# Patient Record
Sex: Female | Born: 1965 | Race: Black or African American | Hispanic: No | Marital: Married
Health system: Southern US, Community
[De-identification: ages and names within clinical notes are randomized; demographics above are authoritative.]

## PROBLEM LIST (undated history)

## (undated) DIAGNOSIS — Z1379 Encounter for other screening for genetic and chromosomal anomalies: Principal | ICD-10-CM

## (undated) HISTORY — DX: Encounter for other screening for genetic and chromosomal anomalies: Z13.79

---

## 2007-09-03 ENCOUNTER — Encounter: Admission: RE | Admit: 2007-09-03 | Discharge: 2007-09-03 | Payer: Self-pay | Admitting: Obstetrics and Gynecology

## 2008-09-11 ENCOUNTER — Ambulatory Visit (HOSPITAL_BASED_OUTPATIENT_CLINIC_OR_DEPARTMENT_OTHER): Admission: RE | Admit: 2008-09-11 | Discharge: 2008-09-11 | Payer: Self-pay | Admitting: Unknown Physician Specialty

## 2009-09-14 ENCOUNTER — Ambulatory Visit (HOSPITAL_BASED_OUTPATIENT_CLINIC_OR_DEPARTMENT_OTHER): Admission: RE | Admit: 2009-09-14 | Discharge: 2009-09-14 | Payer: Self-pay | Admitting: Obstetrics and Gynecology

## 2009-09-14 ENCOUNTER — Ambulatory Visit: Payer: Self-pay | Admitting: Diagnostic Radiology

## 2010-10-07 ENCOUNTER — Ambulatory Visit: Payer: Self-pay | Admitting: Diagnostic Radiology

## 2010-10-07 ENCOUNTER — Ambulatory Visit (HOSPITAL_BASED_OUTPATIENT_CLINIC_OR_DEPARTMENT_OTHER): Admission: RE | Admit: 2010-10-07 | Discharge: 2010-10-07 | Payer: Self-pay | Admitting: Internal Medicine

## 2010-12-24 ENCOUNTER — Encounter: Payer: Self-pay | Admitting: Obstetrics and Gynecology

## 2011-09-29 ENCOUNTER — Other Ambulatory Visit (HOSPITAL_BASED_OUTPATIENT_CLINIC_OR_DEPARTMENT_OTHER): Payer: Self-pay | Admitting: *Deleted

## 2011-09-29 DIAGNOSIS — Z1231 Encounter for screening mammogram for malignant neoplasm of breast: Secondary | ICD-10-CM

## 2011-10-11 ENCOUNTER — Ambulatory Visit (HOSPITAL_BASED_OUTPATIENT_CLINIC_OR_DEPARTMENT_OTHER)
Admission: RE | Admit: 2011-10-11 | Discharge: 2011-10-11 | Disposition: A | Discharge status: Home / Self Care | Payer: PRIVATE HEALTH INSURANCE | Source: Ambulatory Visit | Attending: Diagnostic Radiology | Admitting: Diagnostic Radiology

## 2011-10-11 DIAGNOSIS — Z1231 Encounter for screening mammogram for malignant neoplasm of breast: Secondary | ICD-10-CM | POA: Insufficient documentation

## 2011-10-12 ENCOUNTER — Other Ambulatory Visit (HOSPITAL_BASED_OUTPATIENT_CLINIC_OR_DEPARTMENT_OTHER): Payer: Self-pay | Admitting: Unknown Physician Specialty

## 2011-10-12 DIAGNOSIS — Z1231 Encounter for screening mammogram for malignant neoplasm of breast: Secondary | ICD-10-CM

## 2012-09-23 ENCOUNTER — Other Ambulatory Visit (HOSPITAL_BASED_OUTPATIENT_CLINIC_OR_DEPARTMENT_OTHER): Payer: Self-pay | Admitting: Internal Medicine

## 2012-09-23 DIAGNOSIS — Z1231 Encounter for screening mammogram for malignant neoplasm of breast: Secondary | ICD-10-CM

## 2012-10-11 ENCOUNTER — Ambulatory Visit (HOSPITAL_BASED_OUTPATIENT_CLINIC_OR_DEPARTMENT_OTHER): Payer: PRIVATE HEALTH INSURANCE

## 2012-10-14 ENCOUNTER — Ambulatory Visit (HOSPITAL_BASED_OUTPATIENT_CLINIC_OR_DEPARTMENT_OTHER): Payer: PRIVATE HEALTH INSURANCE

## 2012-10-16 ENCOUNTER — Ambulatory Visit (HOSPITAL_BASED_OUTPATIENT_CLINIC_OR_DEPARTMENT_OTHER)
Admission: RE | Admit: 2012-10-16 | Discharge: 2012-10-16 | Disposition: A | Discharge status: Home / Self Care | Payer: BC Managed Care – PPO | Source: Ambulatory Visit | Attending: Internal Medicine | Admitting: Internal Medicine

## 2012-10-16 DIAGNOSIS — Z1231 Encounter for screening mammogram for malignant neoplasm of breast: Secondary | ICD-10-CM | POA: Insufficient documentation

## 2013-09-09 ENCOUNTER — Other Ambulatory Visit (HOSPITAL_BASED_OUTPATIENT_CLINIC_OR_DEPARTMENT_OTHER): Payer: Self-pay | Admitting: Internal Medicine

## 2013-09-09 DIAGNOSIS — Z1231 Encounter for screening mammogram for malignant neoplasm of breast: Secondary | ICD-10-CM

## 2013-10-17 ENCOUNTER — Other Ambulatory Visit (HOSPITAL_BASED_OUTPATIENT_CLINIC_OR_DEPARTMENT_OTHER): Payer: Self-pay | Admitting: Medical

## 2013-10-17 ENCOUNTER — Ambulatory Visit (HOSPITAL_BASED_OUTPATIENT_CLINIC_OR_DEPARTMENT_OTHER)
Admission: RE | Admit: 2013-10-17 | Discharge: 2013-10-17 | Disposition: A | Discharge status: Home / Self Care | Payer: BC Managed Care – PPO | Source: Ambulatory Visit | Attending: Internal Medicine | Admitting: Internal Medicine

## 2013-10-17 DIAGNOSIS — Z1231 Encounter for screening mammogram for malignant neoplasm of breast: Secondary | ICD-10-CM | POA: Insufficient documentation

## 2014-11-03 ENCOUNTER — Other Ambulatory Visit (HOSPITAL_BASED_OUTPATIENT_CLINIC_OR_DEPARTMENT_OTHER): Payer: Self-pay | Admitting: Medical

## 2014-11-03 DIAGNOSIS — Z1231 Encounter for screening mammogram for malignant neoplasm of breast: Secondary | ICD-10-CM

## 2014-11-06 ENCOUNTER — Other Ambulatory Visit (HOSPITAL_BASED_OUTPATIENT_CLINIC_OR_DEPARTMENT_OTHER): Payer: Self-pay | Admitting: Internal Medicine

## 2014-11-06 ENCOUNTER — Ambulatory Visit (HOSPITAL_BASED_OUTPATIENT_CLINIC_OR_DEPARTMENT_OTHER)
Admission: RE | Admit: 2014-11-06 | Discharge: 2014-11-06 | Disposition: A | Discharge status: Home / Self Care | Payer: BC Managed Care – PPO | Source: Ambulatory Visit | Attending: Medical | Admitting: Medical

## 2014-11-06 DIAGNOSIS — Z1231 Encounter for screening mammogram for malignant neoplasm of breast: Secondary | ICD-10-CM | POA: Insufficient documentation

## 2015-11-17 ENCOUNTER — Other Ambulatory Visit: Payer: Self-pay

## 2015-11-17 DIAGNOSIS — Z1231 Encounter for screening mammogram for malignant neoplasm of breast: Secondary | ICD-10-CM

## 2015-11-18 ENCOUNTER — Other Ambulatory Visit (HOSPITAL_BASED_OUTPATIENT_CLINIC_OR_DEPARTMENT_OTHER): Payer: Self-pay | Admitting: Internal Medicine

## 2015-11-18 DIAGNOSIS — Z1231 Encounter for screening mammogram for malignant neoplasm of breast: Secondary | ICD-10-CM

## 2015-11-25 ENCOUNTER — Ambulatory Visit (HOSPITAL_BASED_OUTPATIENT_CLINIC_OR_DEPARTMENT_OTHER)
Admission: RE | Admit: 2015-11-25 | Discharge: 2015-11-25 | Disposition: A | Discharge status: Home / Self Care | Payer: BLUE CROSS/BLUE SHIELD | Source: Ambulatory Visit | Attending: Internal Medicine | Admitting: Internal Medicine

## 2015-11-25 ENCOUNTER — Other Ambulatory Visit (HOSPITAL_BASED_OUTPATIENT_CLINIC_OR_DEPARTMENT_OTHER): Payer: Self-pay | Admitting: Internal Medicine

## 2015-11-25 DIAGNOSIS — Z1231 Encounter for screening mammogram for malignant neoplasm of breast: Secondary | ICD-10-CM | POA: Insufficient documentation

## 2016-11-15 ENCOUNTER — Other Ambulatory Visit (HOSPITAL_BASED_OUTPATIENT_CLINIC_OR_DEPARTMENT_OTHER): Payer: Self-pay | Admitting: Internal Medicine

## 2016-11-15 DIAGNOSIS — Z1231 Encounter for screening mammogram for malignant neoplasm of breast: Secondary | ICD-10-CM

## 2016-11-30 ENCOUNTER — Ambulatory Visit (HOSPITAL_BASED_OUTPATIENT_CLINIC_OR_DEPARTMENT_OTHER)
Admission: RE | Admit: 2016-11-30 | Discharge: 2016-11-30 | Disposition: A | Discharge status: Home / Self Care | Payer: BLUE CROSS/BLUE SHIELD | Source: Ambulatory Visit | Attending: Internal Medicine | Admitting: Internal Medicine

## 2016-11-30 DIAGNOSIS — Z1231 Encounter for screening mammogram for malignant neoplasm of breast: Secondary | ICD-10-CM | POA: Diagnosis not present

## 2017-07-26 ENCOUNTER — Other Ambulatory Visit: Payer: BLUE CROSS/BLUE SHIELD

## 2017-07-26 ENCOUNTER — Encounter: Payer: Self-pay | Admitting: Genetics

## 2017-07-26 ENCOUNTER — Ambulatory Visit: Payer: BLUE CROSS/BLUE SHIELD | Admitting: Genetics

## 2017-07-26 DIAGNOSIS — Z800 Family history of malignant neoplasm of digestive organs: Secondary | ICD-10-CM

## 2017-07-26 NOTE — Progress Notes (Addendum)
REFERRING PROVIDER: Janine Limbo, PA-C [place], [place] [address]  PRIMARY PROVIDER:  O'Buch, Greta, PA-C  PRIMARY REASON FOR VISIT:  1. Family history of Lynch syndrome     HISTORY OF PRESENT ILLNESS:   Ms. Branagan, a 51 y.o. female, was seen for a [facility] cancer genetics consultation at the request of her sister due to a family history of Lynch syndrome.  Ms. Seeman's sister, Berline Chough DOB 11/07/1968, recently tested positive for a mutation in MSH6 called c.[id number] (p.Ile944Metfs*4).  Ms. Wehrman presents to clinic today to discuss the possibility of a hereditary predisposition to cancer, genetic testing, and to further clarify her future cancer risks, as well as potential cancer risks for family members.   Ms. Kuklinski is a 51 y.o. female with no personal history of cancer.    CANCER HISTORY:   No history exists.    HORMONAL RISK FACTORS:  Menarche was at age 32.  First live birth at age 2.  Ovaries intact: yes.  Hysterectomy: no.  Menopausal status: perimenopausal.  HRT use: 0 years. Colonoscopy: yes; abnormal. Ms. Hulme reports that "a few" polyps were identified and that she was advised to return in 2 years. Mammogram within the last year: yes. Number of breast biopsies: 0. Up to date with pelvic exams:  yes. Any excessive radiation exposure in the past:  no  No past medical history on file.  No past surgical history on file.  Social History   Social History  . Marital status: Married    Spouse name: N/A  . Number of children: N/A  . Years of education: N/A   Social History Main Topics  . Smoking status: Not on file  . Smokeless tobacco: Not on file  . Alcohol use Not on file  . Drug use: Unknown  . Sexual activity: Not on file   Other Topics Concern  . Not on file   Social History Narrative  . No narrative on file     FAMILY HISTORY:  We obtained a detailed, 4-generation family history.  Significant diagnoses  are listed below: Family History  Problem Relation Age of Onset  . Aneurysm Mother 77       d.55  . Heart attack Father 65       d.62  . Breast cancer Sister 63       triple negative  . Other Sister        MSH6 mutation-Lynch syndrome  . Cancer Maternal Uncle        d.68s with unspecified type of cancer  . Other Paternal Uncle        d.30s MVA  . Cancer Maternal Grandmother        d.88s with unspecified type of cancer  . Uterine cancer Sister 9       treated with hysterectomy   Ms. Mesenbrink has four sisters, ages 23, 61, 53, and 15. The 14 year old sister, Ilona Sorrel, was diagnosed with uterine cancer at age 63. Her only treatment was hysterectomy. The 67 year old sister, Lannette Donath, was diagnosed with triple negative breast cancer at 66. 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 A copy of Priscilla's genetic testing result report is included at then end of this note for reference.  Ms. Badgett's mother died of an aneurysm at age 58. Ms. Wallis has five maternal uncles and three maternal aunts. One uncle died with lung cancer in his late-60s. Ms. Grainger's maternal grandmother died in her late-80s  and had a history of an unspecified form of cancer--possibly stomach or pancreatic. Ms. Hawkes's maternal grandfather died in his late-80s without cancers.  Ms. Wadley's father died at age 36 from a heart attack. Ms. Banta has two paternal aunts who are both living without cancers. Her only paternal uncle died in his 49s in a car accident. Both of Ms. Angell's paternal grandparents are deceased, but there is no further information about them.  Patient's maternal ancestors are of Native [place] and [place] American descent, and paternal ancestors are of African American descent. There is no reported Ashkenazi Jewish ancestry. There is no known consanguinity.  GENETIC COUNSELING ASSESSMENT: Shareena Nusz is a 51 y.o. female with a family  history of Lynch syndrome. Because her sister carries the c.2832_2833delAA (p.Ile944Metfs*4) MSH6 mutation, there is a 50% chance that she also carries this mutation. We, therefore, discussed and recommended the following at today's visit.   DISCUSSION: We reviewed the characteristics, risks, management, and inheritance patterns of Lynch syndrome, specifically those associated with mutations in MSH6. We also discussed the process of testing, cost, turn-around-time for results, and GINA. We discussed the implications of a negative, positive, and variant of uncertain significant result. We discussed Ms. Grassi's option to pursue genetic testing for the MSH6 gene only vs testing through a larger hereditary cancer panel. She elected to test MSH6 only.  Genetic testing will be performed through [facility]'s Family Variant Testing program in which Ms. Madole can be tested for free if performed within 90 days of Priscilla's results.   Due to her family history of breast cancer, we calculated Ms. Sanz's lifetime risk for breast cancer using the Tyrer-Cuzick (IBIS) Breast Cancer Risk Model. This model estimates risk through analysis of personal health history and family history. According to this model, her lifetime risk of developing breast cancer is 9.1%. Though this is slightly above average risk for other women her age, Ms. Siems's lifetime breast cancer risk does not appear to warrant increased breast screenings at this time. She was encouraged to continue annual mammograms unless otherwise advised by her physicians.       PLAN: After considering the risks, benefits, and limitations, Ms. Krise  provided informed consent to pursue genetic testing and the blood sample was sent to [facility] for analysis of the MSH6 gene. Results should be available within approximately 2-3 weeks' time, at which point they will be disclosed by telephone to Ms. Mol, as will any additional recommendations  warranted by these results. This information will also be available in Epic.   Lastly, we encouraged Ms. Clendenen to remain in contact with cancer genetics annually so that we can continuously update the family history and inform her of any changes in cancer genetics and testing that may be of benefit for this family.   Ms. Kabel's questions were answered to her satisfaction today. Our contact information was provided should additional questions or concerns arise.   Mal Misty, MS, [facility] Certified Naval [email or web address] phone: [phone]  The patient was seen for a total of 40 minutes in face-to-face genetic counseling.    _______________________________________________________________________ For Office Staff:  Number of people involved in session: 1 Was an Intern/ student involved with case: no  [facility] RESULT REPORT

## 2017-08-09 ENCOUNTER — Encounter: Payer: Self-pay | Admitting: Genetics

## 2017-08-09 ENCOUNTER — Telehealth: Payer: Self-pay | Admitting: Genetics

## 2017-08-09 ENCOUNTER — Ambulatory Visit: Payer: Self-pay | Admitting: Genetics

## 2017-08-09 DIAGNOSIS — Z1379 Encounter for other screening for genetic and chromosomal anomalies: Secondary | ICD-10-CM

## 2017-08-09 HISTORY — DX: Encounter for other screening for genetic and chromosomal anomalies: Z13.79

## 2017-08-09 NOTE — Telephone Encounter (Signed)
Reviewed that sequencing and deletion/duplication analysis of the MSH6 gene revealed no pathogenic mutations. Specifically, this means that Sarah Carney tested negative for the MSH6 c.2832_2833delAA (p.Ile944Metfs*4) mutation that was previously identified in her sister, Sarah Carney.  Testing was performed through [facility]'s sequencing and deletion/duplication analysis of MSH6. Only pathogenic or likely pathogenic mutations are reported through this testing (variants of uncertain significance are not reported). For more detailed discussion, please see genetic counseling documentation from 08/09/2017. Result report dated 08/08/2017.

## 2017-08-09 NOTE — Progress Notes (Addendum)
HPI: Ms. Wawrzyniak was previously seen in the [facility] on 07/26/2017 due to a family history of Lynch syndrome.  Ms. Hewitt's sister, Berline Chough DOB 11/07/1970, recently tested positive for a mutation in MSH6 called c.2832_2833delAA (p.Ile944Metfs*4). Please refer to our prior cancer genetics clinic note for more information regarding Ms. Donaghue's medical, social and family histories, and our assessment and recommendations, at the time. Ms. Bevill's recent genetic test results were disclosed to her, as were recommendations warranted by these results. These results and recommendations are discussed in more detail below.  CANCER HISTORY: Ms. Shroff is a 52 y.o. female with no personal history of cancer.     FAMILY HISTORY:  We obtained a detailed, 4-generation family history.  Significant diagnoses are listed below: Family History  Problem Relation Age of Onset  . Aneurysm Mother 11       d.55  . Heart attack Father 72       d.62  . Breast cancer Sister 33       triple negative  . Other Sister        MSH6 mutation-Lynch syndrome  . Cancer Maternal Uncle        d.68s with unspecified type of cancer  . Other Paternal Uncle        d.30s MVA  . Cancer Maternal Grandmother        d.88s with unspecified type of cancer  . Uterine cancer Sister 62       treated with hysterectomy   Ms. Lofton has four sisters, ages 39, 54, 28, and 80. The 65 year old sister, Ilona Sorrel, was diagnosed with uterine cancer at age 83. Her only treatment was hysterectomy. The 21 year old sister, Lannette Donath, was diagnosed with triple negative breast cancer at 74. 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 A copy of Priscilla's genetic testing result report is included at then end of this note for reference.  Ms. Margulies's mother died of an aneurysm at age 34. Ms. Califano has five maternal uncles and three maternal aunts. One uncle died  with lung cancer in his late-60s. Ms. Karpf's maternal grandmother died in her late-80s and had a history of an unspecified form of cancer--possibly stomach or pancreatic. Ms. Blackham's maternal grandfather died in his late-80s without cancers.  Ms. Bolds's father died at age 54 from a heart attack. Ms. Pottenger has two paternal aunts who are both living without cancers. Her only paternal uncle died in his 82s in a car accident. Both of Ms. Berres's paternal grandparents are deceased, but there is no further information about them.  Patient's maternal ancestors are of Native American and African Americandescent, and paternal ancestors are of African Americandescent. There is noreported Ashkenazi Jewish ancestry. There is noknown consanguinity.  GENETIC TEST RESULTS: Sequencing and deletion/duplication analysis of the MSH6 gene, performed by [facility] and reported out on 08/08/2017, showed no pathogenic mutations. Specifically, this means that Ms. Benzel tested negative for the MSH6 c.2832_2833delAA (p.Ile944Metfs*4) mutation that was previously identified in her sister, Lannette Donath.  The test report will be scanned into EPIC and will be located under the Molecular Pathology section of the Results Review tab.A portion of the result report is included below for reference.     At her initial appointment, we discussed Ms. Landen's option topursue genetic testing for the MSH6gene only vs testing through a larger hereditary cancer panel. She elected to test MSH6 only. Ms. Dicioccio's test was normal and did not reveal the familial mutation.  We call this result a true negative result because the cancer-causing mutation was identified in Ms. Blancett's family, and she did not inherit it.  Given this negative result, Ms. Reap's chances of developing Lynch syndrome-related cancers are the same as they are in the general population. As discussed at her initial appointment, Priscilla's history of breast cancer is  unlikely to be explained by Lynch syndrome. Therefore, Ms. Virgo remains at slightly above-average risk for breast cancer based on family history alone. She should continue annual mammograms unless otherwise advised by her physicians.  ADDITIONAL GENETIC TESTING: We discussed with Ms. Casella that there are other genes that are associated with increased cancer risk that can be analyzed. Without testing additional genes, we cannot rule out the chance that Ms. Hollenberg carries a mutation in a different genet is associated with hereditary cancer risk. However, given that Ms. Seyer has no personal history of cancer and hereditary cancer syndromes are rare, Ms. Baine does not appear to be at high risk for other hereditary cancer syndromes. I recommended that Ms. Machamer's sister, Ilona Sorrel, who had uterine cancer at age 10 undergo genetic testing. Pattie's results would further inform Ms. Garrette's chance to carry a mutation in an untested gene. Should Ms. Bussie wish to pursue additional genetic testing for herself, we are happy to discuss and coordinate this testing, at any time.   CANCER SCREENING RECOMMENDATIONS: Because Ms. Eberle tested negative for the known familial mutation in MSH6, she was encouraged to follow age-based cancer screening recommendations for the general population unless otherwise advised by her physicians.   RECOMMENDATIONS FOR FAMILY MEMBERS: Because Ms. Sula tested negative for the known familial mutation in MSH6, her daughter is not at risk to inherit this mutation and does not need to be tested unless her paternal family history is suggestive of a hereditary cancer syndrome. Genetic testing is recommended for each of Ms. Obando's untested sisters and other extended relatives.  FOLLOW-UP: Lastly, we discussed with Ms. Peraza that cancer genetics is a rapidly advancing field and it is possible that new genetic tests will be appropriate for her and/or her family members in the future. We  encouraged her to remain in contact with cancer genetics on an annual basis so we can update her personal and family histories and let her know of advances in cancer genetics that may benefit this family.   Per Ms. Folse's request, a copy of her results and genetic counseling notes were emailed to her address on file.  Our contact number was provided. Ms. Prieur's questions were answered to her satisfaction, and she knows she is welcome to call us at anytime with additional questions or concerns.   Mal Misty, MS, [facility] Certified Naval [email or web address]

## 2018-01-17 ENCOUNTER — Other Ambulatory Visit (HOSPITAL_BASED_OUTPATIENT_CLINIC_OR_DEPARTMENT_OTHER): Payer: Self-pay | Admitting: Internal Medicine

## 2018-01-17 ENCOUNTER — Ambulatory Visit (HOSPITAL_BASED_OUTPATIENT_CLINIC_OR_DEPARTMENT_OTHER)
Admission: RE | Admit: 2018-01-17 | Discharge: 2018-01-17 | Disposition: A | Discharge status: Home / Self Care | Payer: BLUE CROSS/BLUE SHIELD | Source: Ambulatory Visit | Attending: Internal Medicine | Admitting: Internal Medicine

## 2018-01-17 DIAGNOSIS — Z1231 Encounter for screening mammogram for malignant neoplasm of breast: Secondary | ICD-10-CM | POA: Diagnosis not present

## 2018-01-21 ENCOUNTER — Other Ambulatory Visit: Payer: Self-pay | Admitting: Internal Medicine

## 2018-01-21 DIAGNOSIS — R928 Other abnormal and inconclusive findings on diagnostic imaging of breast: Secondary | ICD-10-CM

## 2018-01-23 ENCOUNTER — Ambulatory Visit
Admission: RE | Admit: 2018-01-23 | Discharge: 2018-01-23 | Disposition: A | Discharge status: Home / Self Care | Payer: BLUE CROSS/BLUE SHIELD | Source: Ambulatory Visit | Attending: Internal Medicine | Admitting: Internal Medicine

## 2018-01-23 ENCOUNTER — Other Ambulatory Visit: Payer: Self-pay | Admitting: Internal Medicine

## 2018-01-23 DIAGNOSIS — R928 Other abnormal and inconclusive findings on diagnostic imaging of breast: Secondary | ICD-10-CM

## 2018-01-24 ENCOUNTER — Other Ambulatory Visit: Payer: BLUE CROSS/BLUE SHIELD

## 2019-09-25 ENCOUNTER — Other Ambulatory Visit: Payer: Self-pay | Admitting: Internal Medicine

## 2019-09-25 DIAGNOSIS — Z1231 Encounter for screening mammogram for malignant neoplasm of breast: Secondary | ICD-10-CM

## 2019-11-12 ENCOUNTER — Other Ambulatory Visit: Payer: Self-pay

## 2019-11-12 ENCOUNTER — Ambulatory Visit
Admission: RE | Admit: 2019-11-12 | Discharge: 2019-11-12 | Disposition: A | Discharge status: Home / Self Care | Payer: BC Managed Care – PPO | Source: Ambulatory Visit | Attending: Internal Medicine | Admitting: Internal Medicine

## 2019-11-12 DIAGNOSIS — Z1231 Encounter for screening mammogram for malignant neoplasm of breast: Secondary | ICD-10-CM

## 2020-11-11 ENCOUNTER — Other Ambulatory Visit: Payer: Self-pay | Admitting: Physician Assistant

## 2020-11-11 DIAGNOSIS — Z1231 Encounter for screening mammogram for malignant neoplasm of breast: Secondary | ICD-10-CM

## 2020-12-15 ENCOUNTER — Other Ambulatory Visit: Payer: Self-pay

## 2020-12-15 ENCOUNTER — Ambulatory Visit
Admission: RE | Admit: 2020-12-15 | Discharge: 2020-12-15 | Disposition: A | Discharge status: Home / Self Care | Payer: BC Managed Care – PPO | Source: Ambulatory Visit | Attending: Physician Assistant | Admitting: Physician Assistant

## 2020-12-15 DIAGNOSIS — Z1231 Encounter for screening mammogram for malignant neoplasm of breast: Secondary | ICD-10-CM

## 2021-12-15 ENCOUNTER — Other Ambulatory Visit: Payer: Self-pay | Admitting: Physician Assistant

## 2021-12-15 DIAGNOSIS — Z1231 Encounter for screening mammogram for malignant neoplasm of breast: Secondary | ICD-10-CM

## 2021-12-28 ENCOUNTER — Other Ambulatory Visit: Payer: Self-pay

## 2021-12-28 ENCOUNTER — Ambulatory Visit
Admission: RE | Admit: 2021-12-28 | Discharge: 2021-12-28 | Disposition: A | Discharge status: Home / Self Care | Payer: BC Managed Care – PPO | Source: Ambulatory Visit | Attending: Physician Assistant | Admitting: Physician Assistant

## 2021-12-28 DIAGNOSIS — Z1231 Encounter for screening mammogram for malignant neoplasm of breast: Secondary | ICD-10-CM

## 2022-06-22 IMAGING — MG MM DIGITAL SCREENING BILAT W/ TOMO AND CAD
8 series · 8 of 24 positions shown · non-contrast
Comparison: Previous exam(s).

CLINICAL DATA: Screening.

EXAM:
DIGITAL SCREENING BILATERAL MAMMOGRAM WITH TOMOSYNTHESIS AND CAD
TECHNIQUE: Bilateral screening digital craniocaudal and mediolateral oblique
mammograms were obtained. Bilateral screening digital breast
tomosynthesis was performed. The images were evaluated with
computer-aided detection.

[R MLO synth-2D]
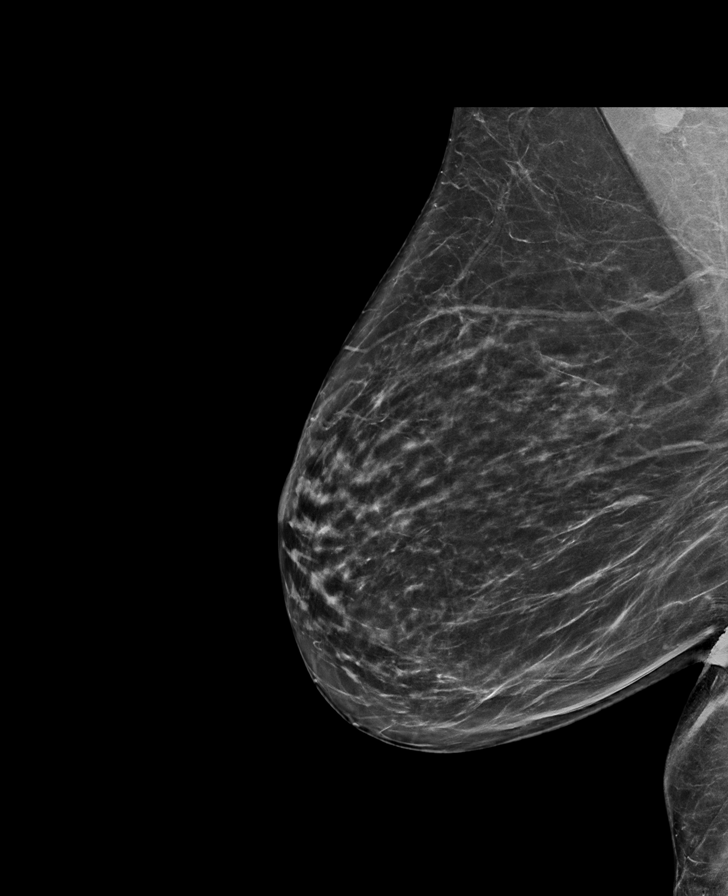

[R CC synth-2D]
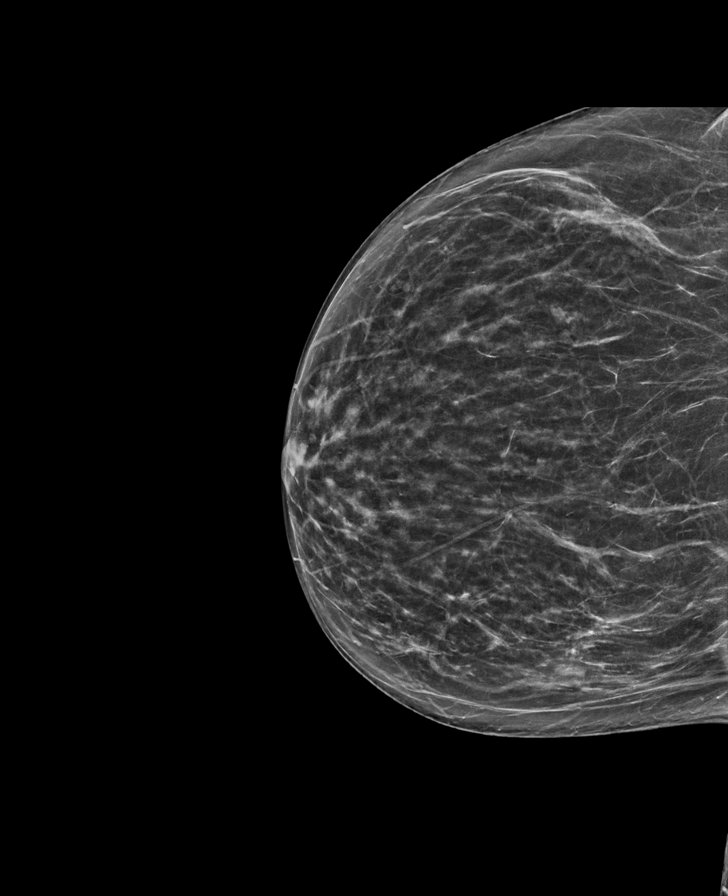

[L CC synth-2D]
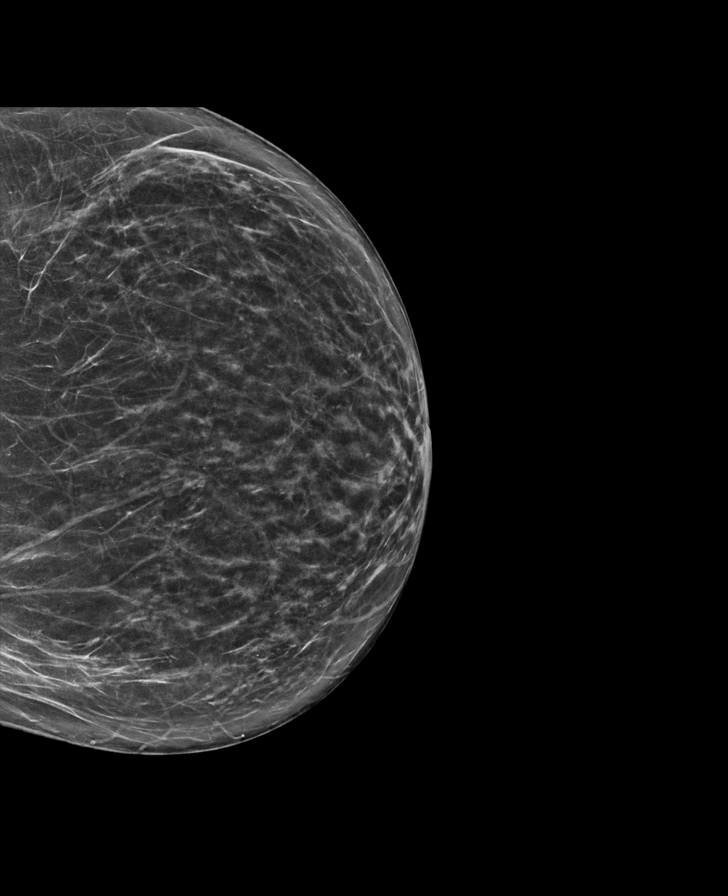

[L MLO synth-2D]
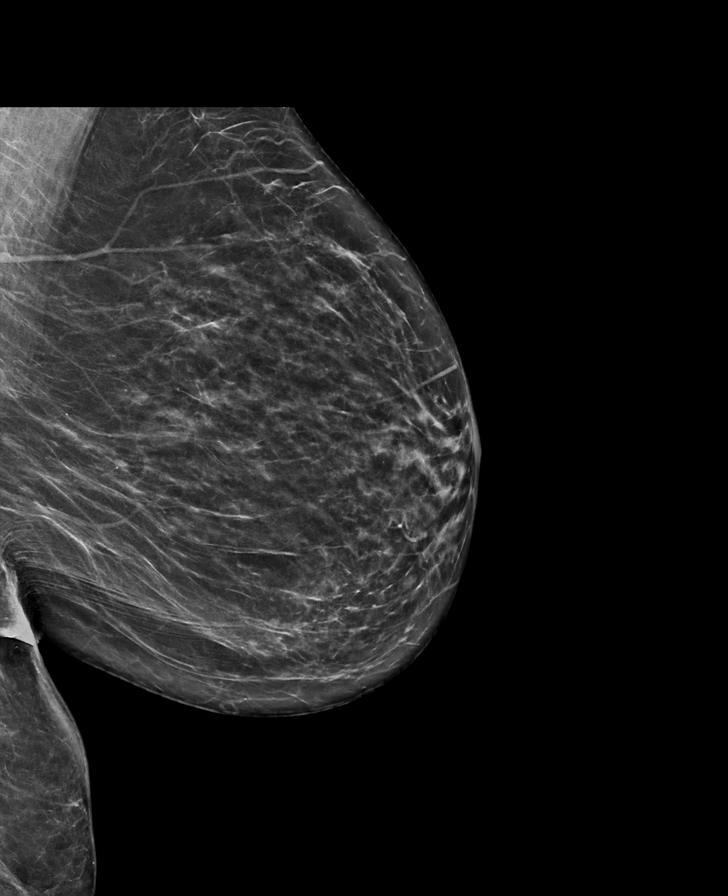

[R CC tomo · tomo slice 33/65.0]
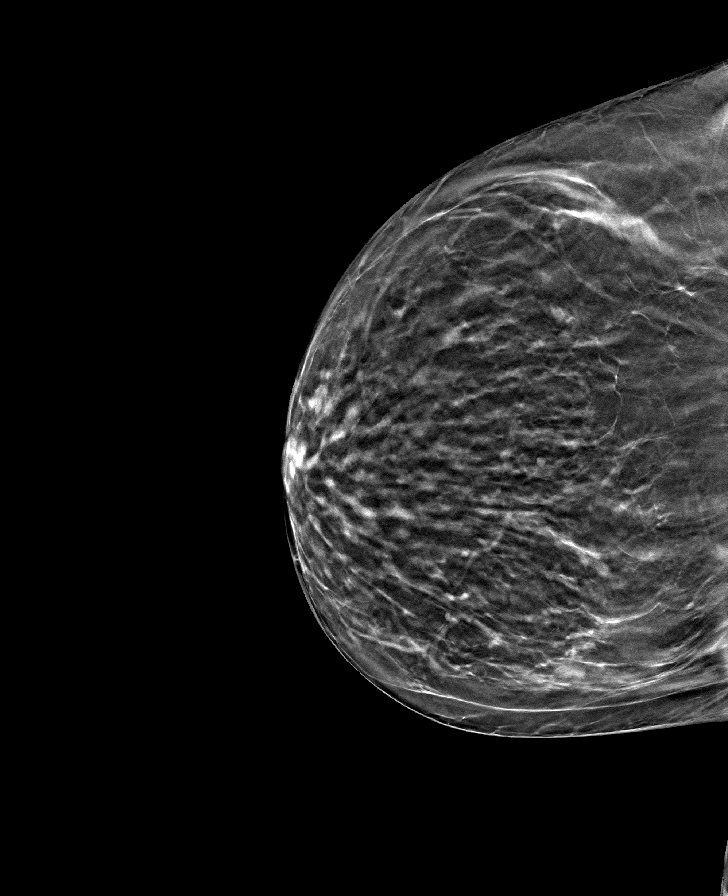

[R MLO tomo · tomo slice 39/76.0]
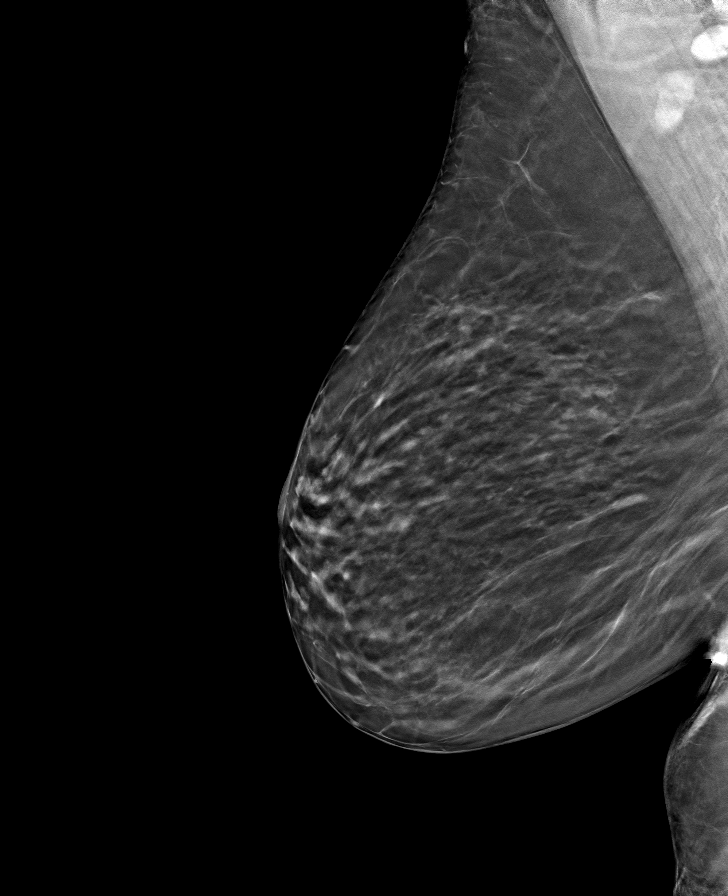

[L MLO tomo · tomo slice 37/72.0]
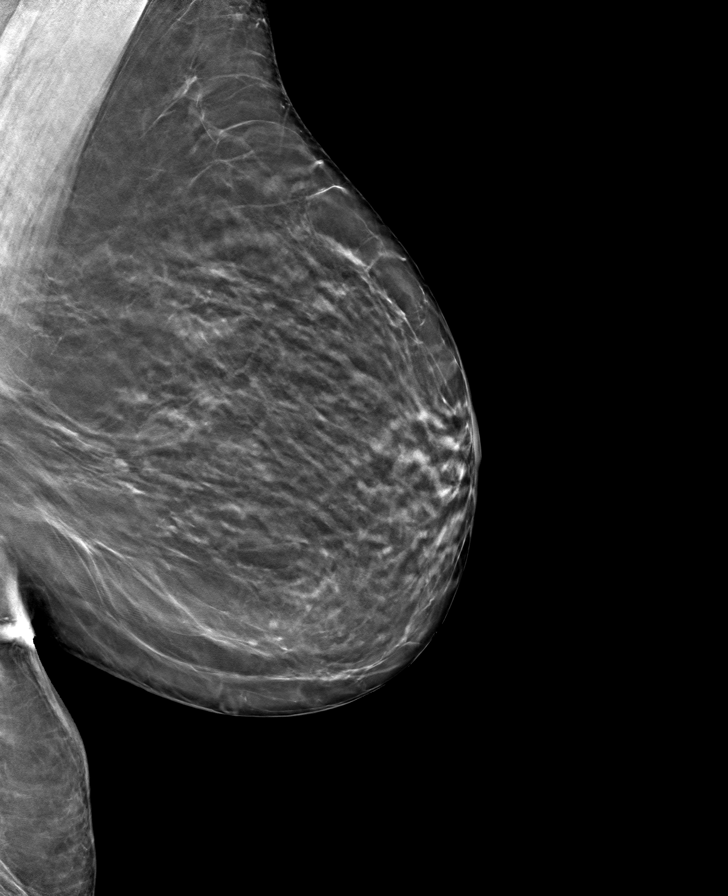

[L CC tomo · tomo slice 33/66.0]
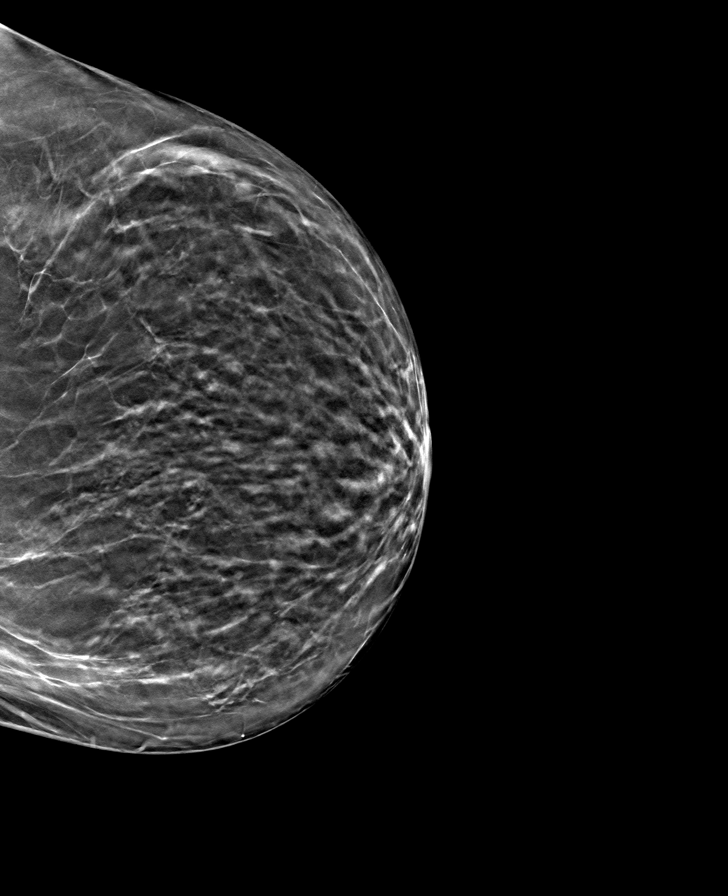

[8 of 24 positions shown; findings below may reference images not displayed]

ACR Breast Density Category b: There are scattered areas of
fibroglandular density.
FINDINGS: There are no findings suspicious for malignancy.
IMPRESSION: No mammographic evidence of malignancy. A result letter of this
screening mammogram will be mailed directly to the patient.

RECOMMENDATION:
Screening mammogram in one year. (Code:51-O-LD2)

BI-RADS CATEGORY  1: Negative.

## 2022-12-06 ENCOUNTER — Other Ambulatory Visit: Payer: Self-pay | Admitting: Physician Assistant

## 2022-12-06 DIAGNOSIS — Z1231 Encounter for screening mammogram for malignant neoplasm of breast: Secondary | ICD-10-CM

## 2023-01-24 ENCOUNTER — Ambulatory Visit: Payer: BC Managed Care – PPO

## 2023-03-12 ENCOUNTER — Ambulatory Visit: Payer: BC Managed Care – PPO

## 2023-03-28 ENCOUNTER — Ambulatory Visit
Admission: RE | Admit: 2023-03-28 | Discharge: 2023-03-28 | Disposition: A | Discharge status: Home / Self Care | Payer: BC Managed Care – PPO | Source: Ambulatory Visit | Attending: Physician Assistant | Admitting: Physician Assistant

## 2023-03-28 DIAGNOSIS — Z1231 Encounter for screening mammogram for malignant neoplasm of breast: Secondary | ICD-10-CM

## 2024-03-12 ENCOUNTER — Other Ambulatory Visit: Payer: Self-pay | Admitting: Internal Medicine

## 2024-03-12 DIAGNOSIS — Z1231 Encounter for screening mammogram for malignant neoplasm of breast: Secondary | ICD-10-CM

## 2024-03-28 ENCOUNTER — Ambulatory Visit

## 2024-03-28 ENCOUNTER — Ambulatory Visit
Admission: RE | Admit: 2024-03-28 | Discharge: 2024-03-28 | Disposition: A | Discharge status: Home / Self Care | Source: Ambulatory Visit | Attending: Internal Medicine | Admitting: Internal Medicine

## 2024-03-28 DIAGNOSIS — Z1231 Encounter for screening mammogram for malignant neoplasm of breast: Secondary | ICD-10-CM
